# Patient Record
Sex: Female | Born: 1990 | Race: Black or African American | Hispanic: No | Marital: Single
Health system: Southern US, Community
[De-identification: ages and names within clinical notes are randomized; demographics above are authoritative.]

## PROBLEM LIST (undated history)

## (undated) DIAGNOSIS — Z800 Family history of malignant neoplasm of digestive organs: Secondary | ICD-10-CM

## (undated) DIAGNOSIS — Z8481 Family history of carrier of genetic disease: Secondary | ICD-10-CM

## (undated) DIAGNOSIS — Z1509 Genetic susceptibility to other malignant neoplasm: Secondary | ICD-10-CM

## (undated) HISTORY — DX: Genetic susceptibility to other malignant neoplasm: Z15.09

## (undated) HISTORY — DX: Family history of malignant neoplasm of digestive organs: Z80.0

## (undated) HISTORY — DX: Family history of carrier of genetic disease: Z84.81

---

## 2015-02-27 ENCOUNTER — Other Ambulatory Visit: Payer: Self-pay | Admitting: Obstetrics and Gynecology

## 2015-02-27 DIAGNOSIS — N6320 Unspecified lump in the left breast, unspecified quadrant: Secondary | ICD-10-CM

## 2015-03-03 ENCOUNTER — Ambulatory Visit
Admission: RE | Admit: 2015-03-03 | Discharge: 2015-03-03 | Disposition: A | Discharge status: Home / Self Care | Payer: BLUE CROSS/BLUE SHIELD | Source: Ambulatory Visit | Attending: Obstetrics and Gynecology | Admitting: Obstetrics and Gynecology

## 2015-03-03 ENCOUNTER — Other Ambulatory Visit: Payer: Self-pay | Admitting: Obstetrics and Gynecology

## 2015-03-03 ENCOUNTER — Other Ambulatory Visit: Payer: Self-pay

## 2015-03-03 DIAGNOSIS — N6320 Unspecified lump in the left breast, unspecified quadrant: Secondary | ICD-10-CM

## 2016-03-21 IMAGING — MG MM DIAG BREAST TOMO UNI LEFT
2 series · 3 of 6 positions shown · non-contrast
Comparison: Baseline study

CLINICAL DATA: Palpable abnormality in the left breast noted on
recent physical exam.

EXAM:
DIGITAL DIAGNOSTIC LEFT MAMMOGRAM
ULTRASOUND LEFT BREAST

[L TAN]
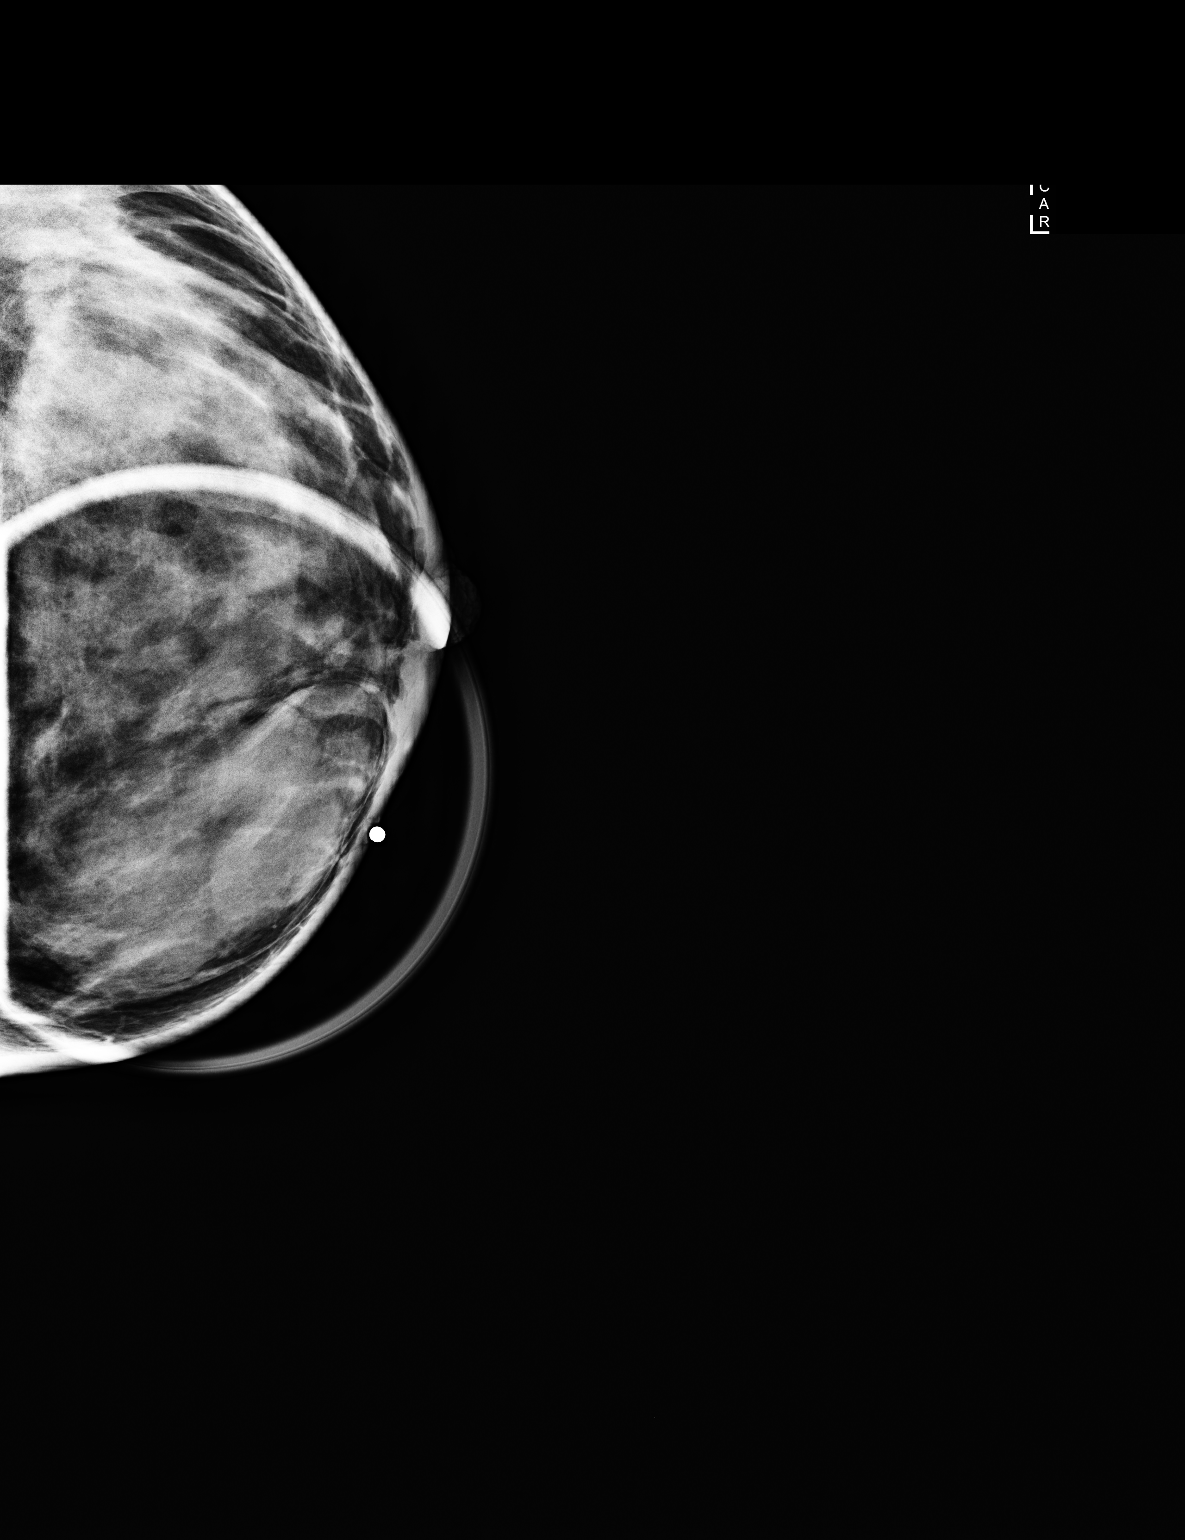

[L TAN tomo · 2 of 56 frames shown]
[frame 19/56]
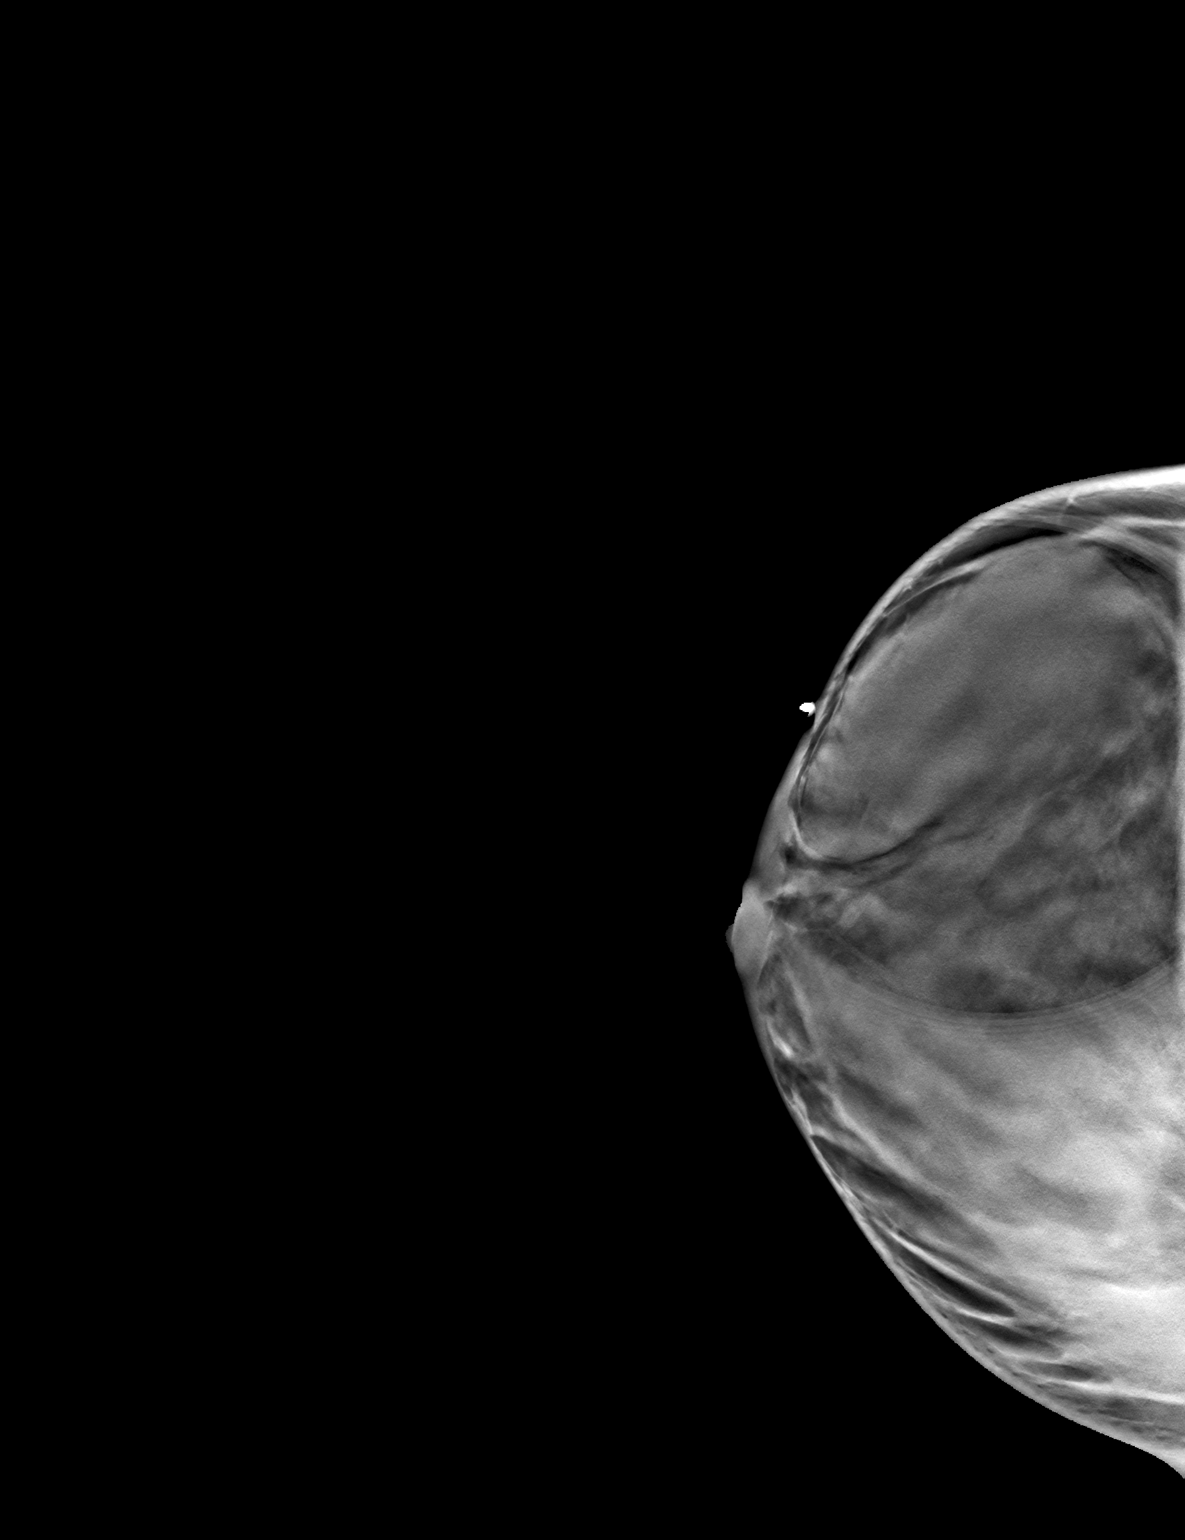
[frame 29/56]
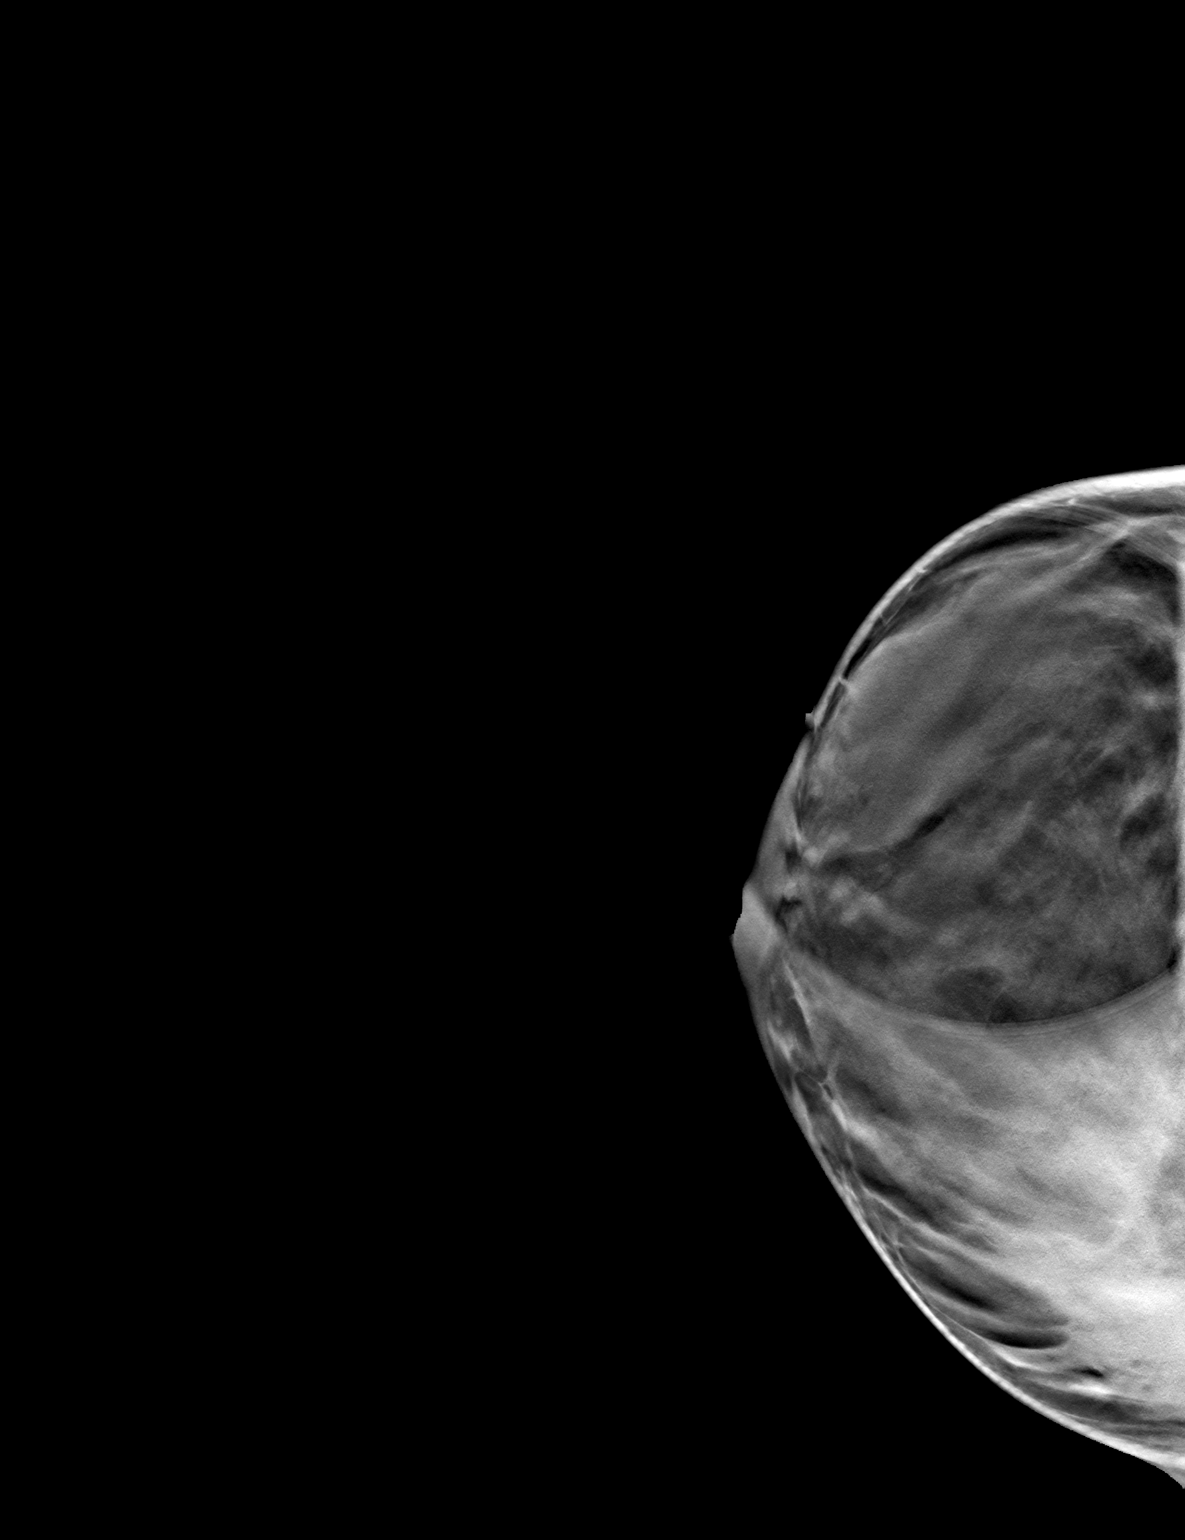

[3 of 6 positions shown; findings below may reference images not displayed]

ACR Breast Density Category d: The breast tissue is extremely dense,
which lowers the sensitivity of mammography.
FINDINGS: On physical exam, I palpate a soft mobile mass medial to the left
nipple.

Targeted ultrasound is performed, showing a mixed echogenicity oval
mass in the 9 o'clock location of the left breast 2 cm from the
nipple. Mass is compressible and measures 5.0 x 5.5 x 1.3 cm. Given
the mixed echogenicity further evaluation with mammogram is
performed. A BB is placed over the abnormality and a spot tangential
view is performed.

Tangential view demonstrates internal subcapsular fat, confirming
that the lesion represents a benign hamartoma/fibroadenolipoma. No
suspicious microcalcifications or distortion identified.
IMPRESSION: Palpable abnormality is a benign hamartoma/fibroadenolipoma. No
mammographic or ultrasound evidence for malignancy.

RECOMMENDATION:
Screening mammogram at age 40 unless there are persistent or
intervening clinical concerns. (Code:[id number])

I have discussed the findings and recommendations with the patient.
Results were also provided in writing at the conclusion of the
visit. If applicable, a reminder letter will be sent to the patient
regarding the next appointment.

BI-RADS CATEGORY  2: Benign.

## 2017-07-29 ENCOUNTER — Telehealth: Payer: Self-pay | Admitting: Genetics

## 2017-07-29 NOTE — Telephone Encounter (Signed)
Genetic counseling appt has been scheduled for the pt to see Jenny Ellis on 4/1 at 4pm. Pt aware to arrive 30 minutes early.

## 2017-08-11 ENCOUNTER — Encounter: Payer: BLUE CROSS/BLUE SHIELD | Admitting: Genetics

## 2017-09-16 ENCOUNTER — Inpatient Hospital Stay: Payer: BLUE CROSS/BLUE SHIELD | Attending: Genetic Counselor | Admitting: Genetics

## 2017-09-16 ENCOUNTER — Inpatient Hospital Stay: Payer: BLUE CROSS/BLUE SHIELD

## 2017-09-16 ENCOUNTER — Encounter: Payer: Self-pay | Admitting: Genetics

## 2017-09-16 DIAGNOSIS — Z8481 Family history of carrier of genetic disease: Secondary | ICD-10-CM | POA: Insufficient documentation

## 2017-09-16 DIAGNOSIS — Z800 Family history of malignant neoplasm of digestive organs: Secondary | ICD-10-CM

## 2017-09-17 NOTE — Progress Notes (Addendum)
REFERRING PROVIDER: No referring provider defined for this encounter.  PRIMARY PROVIDER:  Patient, No Pcp Per  PRIMARY REASON FOR VISIT:  1. Family history of gastric cancer   2. Family history of genetic disease carrier     HISTORY OF PRESENT ILLNESS:   Ms. Picco, a 27 y.o. 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  Ms. Kiernan presents to clinic today to discuss the possibility of a hereditary predisposition to cancer, genetic testing, and to further clarify her future cancer risks, as well as potential cancer risks for family members.   Ms. Spillman is a 27 y.o. female with no personal history of cancer.    HORMONAL RISK FACTORS:  Menarche was at age 57.  First live birth at age N/A OCP use  coper IUD   Ovaries intact: yes.  Hysterectomy: no.  Menopausal status: premenopausal.  HRT use: 0 years. Colonoscopy: no; not examined. Mammogram within the last year: no. Number of breast biopsies: 0.  Past Medical History:  Diagnosis Date  . Family history of gastric cancer   . Family history of genetic disease carrier      Social History   Socioeconomic History  . Marital status: Single    Spouse name: Not on file  . Number of children: Not on file  . Years of education: Not on file  . Highest education level: Not on file  Occupational History  . Not on file  Social Needs  . Financial resource strain: Not on file  . Food insecurity:    Worry: Not on file    Inability: Not on file  . Transportation needs:    Medical: Not on file    Non-medical: Not on file  Tobacco Use  . Smoking status: Not on file  Substance and Sexual Activity  . Alcohol use: Not on file  . Drug use: Not on file  . Sexual activity: Not on file  Lifestyle  . Physical activity:    Days per week: Not on file    Minutes per session: Not on file  . Stress: Not on file   Relationships  . Social connections:    Talks on phone: Not on file    Gets together: Not on file    Attends religious service: Not on file    Active member of club or organization: Not on file    Attends meetings of clubs or organizations: Not on file    Relationship status: Not on file  Other Topics Concern  . Not on file  Social History Narrative  . Not on file     FAMILY HISTORY:  We obtained a detailed, 4-generation family history.  Significant diagnoses are listed below: Family History  Problem Relation Age of Onset  . Gastric cancer Mother 21  . Other Sister        CDH1 + hereditary diffuse gastric cancer   Ms. Mossa has no children.  Ms. Kubicki has a 69 year-old sister and a 50 year-old sister.  Her sister who is 21, recently had genetic testing in Feb 2019 that revealed a CDH1 pathogenic variant c.1342dup (p.Gln448Profs*16).  Her sister is planning on having a prophylactic gastrectomy in September 2019.    Ms. Bradeen father: is 4, no history of cancer.  He is planning to get genetic testing soon.  Paternal Aunts/Uncles: 1 paternal aunt, 1 paternal uncle, no history  of cancer.  Paternal cousins: no history of cancer.  Paternal grandfather: deceased, no history of cancer.  Paternal grandmother:alive in her 70's/ 60's with no history of cancer.   Ms. Dubberly mother: was diagnosed with gastric cancer in her early 62's, she died at 1.  Maternal Aunts/Uncles: 1 maternal uncle is 34 with no history of cancer.  He has 2 daughters.  Maternal cousins: no history of cancer.  Maternal grandfather: deceased, unk cause Maternal grandmother:deceased, no history of cancer.   Patient's maternal ancestors are of Black/African American descent, and paternal ancestors are of Black/African Am and Native American descent. There is no reported Ashkenazi Jewish ancestry. There is no known consanguinity.  GENETIC COUNSELING ASSESSMENT: Pang Robers is a 27 y.o. female with a family  history of a CDH1 pathogenic variant (Hereditary Diffuse Gastric Cancer).   Hereditary diffuse gastric cancer (HDGC) is an inherited disorder that greatly increases an individual's chance of developing diffuse gastric cancer. When diffuse gastric cancer occurs, there is typically no solid tumor. Instead malignant cells multiply underneath the stomach lining, making the lining thick and rigid.  The average age of onset of gastric cancer in individuals with HDGC is 26 years of age (but a range of 14-69 years has been reported).  The estimated cumulative risk of gastric cancer by age 32 years is 70% for men and 56% for women. Women are also at a 42% risk for lobular breast cancer. Some evidence has suggested an increased risk for colon cancer as well.  However, there is insufficient data to confirm this.    The recommendation for individuals with  Hereditary Diffuse Gastric Cancer is to have a prophylactic total gastrectomy.  Endoscopic surveillance that includes random biopsies is an option, but has not been proven to be highly effective in detecting diffuse gastric cancer at an early stage.  Random biopsies has the potential to miss areas of malignancy    Women with HDGD are recommended to have annual mammogram with consideration of tomosynthesis starting at age 31 and should consider breast MRI starting at age 81. There is insufficient evidence to recommend risk reducing mastectomy, however, this can be considered (family history taken into account).   Increased colon cancer screening can be considered and should be based on family history.   GENETIC TESTING:  We discussed the options of single site testing versus panel testing.  We discussed the implications of a negative, positive and/or variant of uncertain significant result. We recommended Ms. Grell pursue genetic testing for the Common Hereditary Cancers gene panel.  The Common Hereditary Cancer Panel offered by [facility] includes sequencing and/or  deletion duplication testing of the following 47 genes: APC, ATM, AXIN2, BARD1, BMPR1A, BRCA1, BRCA2, BRIP1, CDH1, CDKN2A (p14ARF), CDKN2A (p16INK4a), CKD4, CHEK2, CTNNA1, DICER1, EPCAM (Deletion/duplication testing only), GREM1 (promoter region deletion/duplication testing only), KIT, MEN1, MLH1, MSH2, MSH3, MSH6, MUTYH, NBN, NF1, NHTL1, PALB2, PDGFRA, PMS2, POLD1, POLE, PTEN, RAD50, RAD51C, RAD51D, SDHB, SDHC, SDHD, SMAD4, SMARCA4. STK11, TP53, TSC1, TSC2, and VHL.  The following genes were evaluated for sequence changes only: SDHA and HOXB13 c.251G>A variant only.  We discussed that if she is found to have a mutation in one of these genes, it may impact future medical managemen recommendations such as increased cancer screenings and consideration of risk reducing surgeries.  A positive result could also have implications for the patient's family members.  A Negative result would mean Ms. Postma did not inherit the familial CDH1 mutation, and that no other pathogenic variants were  identified in any other genes analyzed in the panel.  This would indicate she is likely at about the general population risk to develop cancer. However, genetic testing cannot definitively rule out a hereditary predisposition to cancer.  There could be variants undetectable by current technology, or in genes not yet tested or identified to increase cancer risk.    We discussed the potential to find a Variant of Uncertain Significance or VUS.  These are variants that have not yet been identified as pathogenic or benign, and it is unknown if this variant is associated with increased cancer risk or if this is a normal finding.  Most VUS's are reclassified to benign or likely benign.   It should not be used to make medical management decisions. With time, we suspect the lab will determine the significance of any VUS's identified if any.   Based on Ms. Pond's family history of cancer, she meets medical criteria for genetic testing.  Despite that she meets criteria, she may still have an out of pocket cost.  Ms. Rabon indicated she would prefer to be tested for a panel of genes.  The laboratory will send an estimate of the cost and she can decide to proceed with billing insurance, the patient pay $250 option, or she has the option to switch to the free family variant testing program until Sep 20, 2017 (90 days from the date of her sister's testing).  She was instructed to contact me if she would like to switch tot he family variant testing option by Sep 20, 2017.  We discussed that some people do not want to undergo genetic testing due to fear of genetic discrimination.  A federal law called the Genetic Information Non-Discrimination Act (GINA) of 2008 helps protect individuals against genetic discrimination based on their genetic test results.  It impacts both health insurance and employment.  For health insurance, it protects against increased premiums, being kicked off insurance or being forced to take a test in order to be insured.  For employment it protects against hiring, firing and promoting decisions based on genetic test results.  Health status due to a cancer diagnosis is not protected under GINA.  This law does not protect life insurance, disability insurance, or other types of insurance.    PLAN: After considering the risks, benefits, and limitations, Ms. Chiu  provided informed consent to pursue genetic testing and the blood sample was sent to [facility] for analysis of the Common Hereditary Cancers Panel. Results should be available within approximately 2-3 weeks' time, at which point they will be disclosed by telephone to Ms. Ellender, as will any additional recommendations warranted by these results. Ms. Monreal will receive a summary of her genetic counseling visit and a copy of her results once available. This information will also be available in Epic. We encouraged Ms. Cheuvront to remain in contact with cancer  genetics annually so that we can continuously update the family history and inform her of any changes in cancer genetics and testing that may be of benefit for her family. Ms. Lengyel questions were answered to her satisfaction today. Our contact information was provided should additional questions or concerns arise.  Based on Ms. Deiter's family history, we recommended her siblings and relatives on both sides of the family (until we can confirm which side of the family the CDH1 mutation was inherited from) also have genetic testing.  Ms. Rice will let us know if we can be of any assistance in coordinating genetic counseling  and/or testing for this family member.   Lastly, we encouraged Ms. Oesterle to remain in contact with cancer genetics annually so that we can continuously update the family history and inform her of any changes in cancer genetics and testing that may be of benefit for this family.   Ms.  Woodbury questions were answered to her satisfaction today. Our contact information was provided should additional questions or concerns arise. Thank you for the referral and allowing [place] to share in the care of your patient.   Tana Felts, MS Genetic Counselor [email or web address] phone: [phone]  The patient was seen for a total of 40 minutes in face-to-face genetic counseling.

## 2017-09-19 ENCOUNTER — Telehealth: Payer: Self-pay | Admitting: Genetics

## 2017-09-19 NOTE — Telephone Encounter (Signed)
Checked in with patient about if she received insurance estimate for the panel and to clarify if she needed to switch to the Family variant testing option before her sister's 90 day free testing window ran out 09/20/2017. She said that even if it is a high OOP cost, she would  prefer to still pay the self pay $250 and get the full panel.

## 2017-09-26 ENCOUNTER — Telehealth: Payer: Self-pay | Admitting: Genetics

## 2017-09-26 NOTE — Telephone Encounter (Addendum)
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    We had discussed the medical management for individuals with CDH1 mutations in detail at her previous appointment.  I reiterated the recommendations for individuals with hereditary diffuse gastric cancer.   Ms. Phuong expressed interest in a study at [facility] for families with CDH1 and I told her I could look more into this study and get more information for her.  Our plan is to let her take in this news and come up with any questions she may have, and think about what next steps she would like to take (follow-up with genetics, consult with surgery, nutrition, etc).  I will look into the study and will reach out in a week or 2 with more information I find.  We will then discuss what next steps she would like to take in beginning to manage her cancer risks and consider prophylactic gastrectomy.   Discussed the patient support organization, No stomach for cancer as a resource.  I encouraged her to reach out with any questions or concerns in the meantime.  I will reach out to her in a week or 2 to make a concrete plan for next steps.

## 2017-11-03 ENCOUNTER — Encounter: Payer: Self-pay | Admitting: Genetics

## 2017-11-03 ENCOUNTER — Ambulatory Visit: Payer: Self-pay | Admitting: Genetics

## 2017-11-03 ENCOUNTER — Telehealth: Payer: Self-pay | Admitting: Genetics

## 2017-11-03 DIAGNOSIS — Z1501 Genetic susceptibility to malignant neoplasm of breast: Secondary | ICD-10-CM

## 2017-11-03 DIAGNOSIS — Z8481 Family history of carrier of genetic disease: Secondary | ICD-10-CM

## 2017-11-03 DIAGNOSIS — Z1509 Genetic susceptibility to other malignant neoplasm: Secondary | ICD-10-CM

## 2017-11-03 DIAGNOSIS — Z1379 Encounter for other screening for genetic and chromosomal anomalies: Secondary | ICD-10-CM

## 2017-11-03 DIAGNOSIS — Z1589 Genetic susceptibility to other disease: Principal | ICD-10-CM

## 2017-11-03 DIAGNOSIS — Z800 Family history of malignant neoplasm of digestive organs: Secondary | ICD-10-CM

## 2017-11-03 HISTORY — DX: Genetic susceptibility to other malignant neoplasm: Z15.09

## 2017-11-03 NOTE — Progress Notes (Signed)
HPI:  [name] was previously seen in the [facility] clinic on 09/16/2017 due to a family history of Hereditary Diffuse Gastric Cancer Syndrome. Please refer to our prior cancer genetics clinic note for more information regarding [name]'s medical, social and family histories, and our assessment and recommendations, at the time. [name]'s recent genetic test results were disclosed to her, as well as recommendations warranted by these results. These results and recommendations are discussed in more detail below.  CANCER HISTORY:   No history exists.     FAMILY HISTORY:  We obtained a detailed, 4-generation family history.  Significant diagnoses are listed below: Family History  Problem Relation Age of Onset  . Gastric cancer Mother 242  . Other Sister        CDH1 + hereditary diffuse gastric cancer   [name] has no children.  [name] has a 399 year-old sister and a 26 year-old sister.  Her sister who is 6229, recently had genetic testing in Feb 2019 that revealed a CDH1 pathogenic variant c.1342dup (p.Gln448Profs*16).  Her sister is planning on having a prophylactic gastrectomy in September 2019.    [name]'s father: is 8455, no history of cancer.  He is planning to get genetic testing soon.  Paternal Aunts/Uncles: 1 paternal aunt, 1 paternal uncle, no history of cancer.  Paternal cousins: no history of cancer.  Paternal grandfather: deceased, no history of cancer.  Paternal grandmother:alive in her 70's/ 280's with no history of cancer.   [name]'s mother: was diagnosed with gastric cancer in her early 1240's, she died at 4944.  Maternal Aunts/Uncles: 1 maternal uncle is 6150 with no history of cancer.  He has 2 daughters.  Maternal cousins: no history of cancer.  Maternal grandfather: deceased, unk cause Maternal grandmother:deceased, no history of cancer.   Patient's maternal ancestors are of Black/African American descent, and paternal ancestors are of  Black/African Am and Native American descent. There is no reported Ashkenazi Jewish ancestry. There is no known consanguinity.  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 c.394G>A (p.Val132Ile)  The test report will be scanned into EPIC and will be located under the Molecular Pathology section of the Results Review tab. A portion of the result report is included below for reference.       We discussed with [name] that because current genetic testing is not perfect, it is possible there may be a gene mutation in one of these genes that current testing cannot detect, but that chance is small.  We also discussed, that there could be another gene that has not yet been discovered, or that we have not yet tested, that is responsible for the cancer diagnoses in the family. It is also possible there is a hereditary cause for the cancer in the family that [name] did not inherit and therefore was not identified in her testing.  Therefore, it is important to remain in touch with cancer genetics in the future so that we can continue to offer [name] the most up to date genetic testing.   Regarding the VUS in CDH1: At this time, it is unknown if this variant is associated with increased cancer risk or if this is a normal finding, but most variants  such as this get reclassified to being inconsequential. It should not be used to make medical management decisions. With time, we suspect the lab will determine the significance of this variant, if any. If we do learn more about it, we will try to contact [name] to discuss it further. However, it is important to stay in touch with [place] periodically and keep the address and phone number  up to date.  HEREDITARY DIFFUSE GASTRIC CANCER/CDH1:  Hereditary diffuse gastric cancer (HDGC) is an inherited disorder that greatly increases an individual's chance of developing diffuse gastric cancer. When diffuse gastric cancer occurs, there is typically no solid tumor. Instead malignant cells multiply underneath the stomach lining, making the lining thick and rigid.  The average age of onset of gastric cancer in individuals with HDGC is 26 years of age (but a range of 14-69 years has been reported).  The estimated cumulative risk of gastric cancer by age 80 years is 70% for men and 56% for women. Women are also at a 42% risk for lobular breast cancer. Some evidence has suggested an increased risk for colon cancer as well.  However, there is insufficient data to confirm this.    DIFFUSE GASTRIC CANCER: The recommendation for individuals with  Hereditary Diffuse Gastric Cancer is to have a prophylactic total gastrectomy.  Endoscopic surveillance that includes random biopsies is an option, but has not been proven to be highly effective in detecting diffuse gastric cancer at an early stage.  Random biopsies has the potential to miss areas of malignancy     BREAST CANCER: Women with HDGD are recommended to have annual mammogram with consideration of tomosynthesis starting at age 41 and should consider breast MRI starting at age 37. There is insufficient evidence to recommend risk reducing mastectomy, however, this can be considered (family history taken into account).   Increased colon cancer screening can be considered and should be based on family history.   ADDITIONAL GENETIC TESTING: We discussed with [name] that there are other genes that are associated with increased cancer risk that can be analyzed. The laboratories that offer this testing look at these additional genes via a hereditary cancer gene panel. Should [name] wish to pursue additional genetic testing, we are happy to discuss  and coordinate this testing, at any time.    RECOMMENDATIONS FOR FAMILY MEMBERS:   We recommend all relatives also have genetic testing for this mutation.  While we suspect this was inherited from her mother based on the family history, we do recommend her father have genetic testing (or paternal relatives if he declines) in order to confirm this.  This mutation could be in siblings, aunts, uncles, cousins, etc.    Any future children [name] may have would be at a 50% chance to have inherited this same mutation.  There are some reproductive options that can be considered (prenatal dx, preimplantation genetic diagnosis, etc) that can be discussed and considered should she be planning a family in the future.    PLAN:  [name] reports that she does not have a PCP, OB or healthcare provider she sees regularly for routine healthcare. We recommended she find a PCP provider.    We offered [name] the option to see one of our oncologists in high risk clinic as well as a GI surgeon who could discuss and follow her for this indication and help answer questions about the prophylactic gastrectomy.  [name] declined follow-up with our oncologists here.  [name] was interested in participating in a clinical trial at the [facility] for individuals with hereditary diffuse gastric cancer.  We have provided her with the contact for this study and she says she is going to apply for this study  (her sister is also participating).  If accepted into the study, she plans to do her surgery/recieve follow-up care with those providers.  We informed her we would be happy to send any documentation needed to the research team.   She reports she will contact me to let me know if she is accepted and if she will be having her care at the [facility].    SUPPORT GROUPS: We directed Ms. Sherlin to the patient organization 'No stomach for cancer' for more information and support regarding this diagnosis.   Our contact number was  provided. [name] questions were answered to her satisfaction, and she knows she is welcome to call us at anytime with additional questions or concerns.   Darral Dash, MS, [facility] Certified Genetic Counselor [email or web address]

## 2017-11-03 NOTE — Telephone Encounter (Signed)
email message from patient: 10/13/2017.   Hello [name],  Sorry for being so quiet, just so much to take in. Thank you for letting me know about [facility]. I talked with my sister and that's the trial she's a part of, so we can go through this together. I'm working on the preliminary paperwork that [employer] emailed to me. I would like to continue my care through [facility] if I'm accepted into the study. And I'm afraid I don't have a primary doctor yet. I recently got my own insurance but haven't gotten to setting up a new primary doctor.  Thank you so much for all your help, and I'll be sure to let you know my next steps and what comes of the [facility] study if I'm accepted.  Sincerely,  Jenny DeanerMeredith Gosa

## 2018-05-25 ENCOUNTER — Encounter: Payer: Self-pay | Admitting: Genetics

## 2018-05-25 NOTE — Progress Notes (Signed)
Patient was seen at the [facility] for consultation with the [facility] specialty clinic/research study.

## 2018-05-25 NOTE — Progress Notes (Signed)
Amended Report: The variant of uncertain significance in the gene CDH1  c.394G>A (p.Val132Ile) was reclassified to Benign.  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

## 2024-03-11 ENCOUNTER — Telehealth (HOSPITAL_COMMUNITY): Payer: Self-pay | Admitting: Pharmacy Technician

## 2024-03-11 ENCOUNTER — Other Ambulatory Visit (HOSPITAL_COMMUNITY): Payer: Self-pay | Admitting: Family Medicine

## 2024-03-11 ENCOUNTER — Encounter (HOSPITAL_COMMUNITY): Payer: Self-pay | Admitting: Family Medicine

## 2024-03-11 DIAGNOSIS — D509 Iron deficiency anemia, unspecified: Secondary | ICD-10-CM | POA: Insufficient documentation

## 2024-03-11 NOTE — Telephone Encounter (Signed)
 Auth Submission: NO AUTH NEEDED Site of care: [facility] Payer: [employer] Medication & CPT/J Code(s) submitted: J1750 - INFED Diagnosis Code: D50.9 Route of submission (phone, fax, portal): phone Phone # [phone] Fax # Auth type: Buy/Bill HB Units/visits requested:  Reference number: [id number] Approval from: 03/11/2024  to 05/12/24    Dagoberto Armour, CPhT [facility] Phone: [phone] 03/11/2024

## 2024-03-12 NOTE — Telephone Encounter (Addendum)
 Auth Submission: APPROVED Site of care: [facility] Payer: [employer] Medication & CPT/J Code(s) submitted: Feraheme (ferumoxytol) [id number] Diagnosis Code: D50.9 Route of submission (phone, fax, portal): fax Phone # Fax # Auth type: Buy/Bill HB Units/visits requested: 510mg  x 2 doses Reference number: [id number] Approval from: 03/29/24 to 04/28/24  APPROVAL LETTER SCANNED INTO MEDIA TAB.   INFed is not able to administered at [facility]. Symphonie spoke with office regarding other options. I called [employer] and per rep Jasmine, Venofer was a preferred med with no auth needed. (Feraheme and Monoferric require PA).   Marleena Shubert, CPhT [facility] Phone: [phone] 03/30/2024

## 2024-03-29 ENCOUNTER — Telehealth (HOSPITAL_COMMUNITY): Payer: Self-pay

## 2024-03-29 ENCOUNTER — Other Ambulatory Visit (HOSPITAL_COMMUNITY): Payer: Self-pay

## 2024-03-29 NOTE — Telephone Encounter (Signed)
 Patient referred to infusion pharmacy team for ambulatory infusion of IV iron.  Insurance - Insurance Underwriter of care - Site of care: [facility] Dx code - D50.9 IV Iron Therapy - Feraheme 510 mg x 2 Infusion appointments - Scheduling team will schedule patient as soon as possible.   Thank you,  Norton Blush, PharmD, [facility] Pharmacist [facility]

## 2024-03-30 ENCOUNTER — Encounter (HOSPITAL_COMMUNITY): Payer: Self-pay
